# Patient Record
Sex: Male | Born: 1990 | Race: Black or African American | Hispanic: No | Smoking: Never smoker
Health system: Southern US, Community
[De-identification: ages and names within clinical notes are randomized; demographics above are authoritative.]

---
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
## 2015-02-01 ENCOUNTER — Encounter (HOSPITAL_COMMUNITY): Payer: Self-pay | Admitting: Emergency Medicine

## 2015-02-01 ENCOUNTER — Emergency Department (HOSPITAL_COMMUNITY)
Admission: EM | Admit: 2015-02-01 | Discharge: 2015-02-01 | Disposition: A | Discharge status: Home / Self Care | Payer: Self-pay | Attending: Emergency Medicine | Admitting: Emergency Medicine

## 2015-02-01 ENCOUNTER — Emergency Department (HOSPITAL_COMMUNITY): Payer: Self-pay

## 2015-02-01 DIAGNOSIS — Y939 Activity, unspecified: Secondary | ICD-10-CM | POA: Insufficient documentation

## 2015-02-01 DIAGNOSIS — S42301B Unspecified fracture of shaft of humerus, right arm, initial encounter for open fracture: Secondary | ICD-10-CM

## 2015-02-01 DIAGNOSIS — Y929 Unspecified place or not applicable: Secondary | ICD-10-CM | POA: Insufficient documentation

## 2015-02-01 DIAGNOSIS — T1490XA Injury, unspecified, initial encounter: Secondary | ICD-10-CM

## 2015-02-01 DIAGNOSIS — Y999 Unspecified external cause status: Secondary | ICD-10-CM | POA: Insufficient documentation

## 2015-02-01 DIAGNOSIS — S42391B Other fracture of shaft of right humerus, initial encounter for open fracture: Secondary | ICD-10-CM | POA: Insufficient documentation

## 2015-02-01 LAB — COMPREHENSIVE METABOLIC PANEL
ALT: 12 U/L — ABNORMAL LOW (ref 17–63)
ANION GAP: 15 (ref 5–15)
AST: 32 U/L (ref 15–41)
Albumin: 4.3 g/dL (ref 3.5–5.0)
Alkaline Phosphatase: 36 U/L — ABNORMAL LOW (ref 38–126)
BILIRUBIN TOTAL: 0.6 mg/dL (ref 0.3–1.2)
BUN: 14 mg/dL (ref 6–20)
CO2: 19 mmol/L — ABNORMAL LOW (ref 22–32)
Calcium: 9.6 mg/dL (ref 8.9–10.3)
Chloride: 103 mmol/L (ref 101–111)
Creatinine, Ser: 1.56 mg/dL — ABNORMAL HIGH (ref 0.61–1.24)
GFR calc non Af Amer: >60 mL/min (ref >60)
Glucose, Bld: 135 mg/dL — ABNORMAL HIGH (ref 65–99)
POTASSIUM: 3.4 mmol/L — AB (ref 3.5–5.1)
Sodium: 137 mmol/L (ref 135–145)
TOTAL PROTEIN: 8 g/dL (ref 6.5–8.1)

## 2015-02-01 LAB — PROTIME-INR
INR: 1.14 (ref 0.00–1.49)
PROTHROMBIN TIME: 14.8 s (ref 11.6–15.2)

## 2015-02-01 LAB — CBC
HEMATOCRIT: 41.8 % (ref 39.0–52.0)
HEMOGLOBIN: 13.8 g/dL (ref 13.0–17.0)
MCH: 24.6 pg — ABNORMAL LOW (ref 26.0–34.0)
MCHC: 33 g/dL (ref 30.0–36.0)
MCV: 74.6 fL — ABNORMAL LOW (ref 78.0–100.0)
Platelets: 205 10*3/uL (ref 150–400)
RBC: 5.6 MIL/uL (ref 4.22–5.81)
RDW: 15 % (ref 11.5–15.5)
WBC: 8.2 10*3/uL (ref 4.0–10.5)

## 2015-02-01 LAB — PREPARE FRESH FROZEN PLASMA
Unit division: 0
Unit division: 0

## 2015-02-01 LAB — TYPE AND SCREEN
ABO/RH(D): A POS
Antibody Screen: NEGATIVE
UNIT DIVISION: 0
UNIT DIVISION: 0

## 2015-02-01 LAB — ABO/RH: ABO/RH(D): A POS

## 2015-02-01 LAB — ETHANOL: Alcohol, Ethyl (B): <5 mg/dL (ref <5)

## 2015-02-01 MED ORDER — OXYCODONE-ACETAMINOPHEN 5-325 MG PO TABS: 1.0000 | ORAL_TABLET | Freq: Four times a day (QID) | ORAL | Status: AC | PRN

## 2015-02-01 MED ORDER — SODIUM CHLORIDE 0.9 % IV BOLUS (SEPSIS)
1000.0000 mL | Freq: Once | INTRAVENOUS | Status: AC
  Administered 2015-02-01: 1000 mL via INTRAVENOUS

## 2015-02-01 MED ORDER — ONDANSETRON HCL 4 MG/2ML IJ SOLN
4.0000 mg | Freq: Once | INTRAMUSCULAR | Status: AC
  Administered 2015-02-01: 4 mg via INTRAVENOUS

## 2015-02-01 MED ORDER — HYDROMORPHONE HCL 1 MG/ML IJ SOLN
1.0000 mg | Freq: Once | INTRAMUSCULAR | Status: AC
  Administered 2015-02-01: 1 mg via INTRAVENOUS

## 2015-02-01 MED ORDER — CYCLOBENZAPRINE HCL 10 MG PO TABS: 10.0000 mg | ORAL_TABLET | Freq: Two times a day (BID) | ORAL | Status: AC | PRN

## 2015-02-01 MED ORDER — OXYCODONE-ACETAMINOPHEN 5-325 MG PO TABS
2.0000 | ORAL_TABLET | Freq: Once | ORAL | Status: AC
  Administered 2015-02-01: 2 via ORAL
  Filled 2015-02-01: qty 2

## 2015-02-01 MED ORDER — IOHEXOL 350 MG/ML SOLN
100.0000 mL | Freq: Once | INTRAVENOUS | Status: AC | PRN
  Administered 2015-02-01: 100 mL via INTRAVENOUS

## 2015-02-01 MED ORDER — HYDROMORPHONE HCL 1 MG/ML IJ SOLN
1.0000 mg | Freq: Once | INTRAMUSCULAR | Status: AC
  Administered 2015-02-01: 1 mg via INTRAVENOUS
  Filled 2015-02-01: qty 1
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
## 2015-02-01 NOTE — ED Notes (Signed)
Ortho tech at bedside to splint arm 

## 2015-02-01 NOTE — ED Notes (Addendum)
One black wallet and two one dollar bills locked in security. Apple watch and silver colored necklace added to belongings for security. Verified with Eme, RN

## 2015-02-01 NOTE — ED Notes (Addendum)
Ortho tech placing \\plaster  splint  To  The rt humerus

## 2015-02-01 NOTE — ED Notes (Signed)
Dr Rayburn Ma here to see

## 2015-02-01 NOTE — ED Notes (Signed)
Dr. Blackman at bedside. 

## 2015-02-01 NOTE — ED Notes (Signed)
Pt at CT, Eme RN with patient and patient.

## 2015-02-01 NOTE — ED Notes (Signed)
Assisted Ortho with splinting of right humerus
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
## 2015-02-07 ENCOUNTER — Encounter (HOSPITAL_COMMUNITY): Payer: Self-pay | Admitting: Emergency Medicine

## 2015-02-07 ENCOUNTER — Emergency Department (HOSPITAL_COMMUNITY)
Admission: EM | Admit: 2015-02-07 | Discharge: 2015-02-07 | Disposition: A | Discharge status: Home / Self Care | Payer: Medicaid Other | Attending: Emergency Medicine | Admitting: Emergency Medicine

## 2015-02-07 DIAGNOSIS — Z5189 Encounter for other specified aftercare: Secondary | ICD-10-CM

## 2015-02-07 DIAGNOSIS — Z4801 Encounter for change or removal of surgical wound dressing: Secondary | ICD-10-CM | POA: Insufficient documentation

## 2015-02-07 DIAGNOSIS — Z792 Long term (current) use of antibiotics: Secondary | ICD-10-CM | POA: Insufficient documentation

## 2015-02-07 LAB — CBC
HCT: 33.7 % — ABNORMAL LOW (ref 39.0–52.0)
Hemoglobin: 11.2 g/dL — ABNORMAL LOW (ref 13.0–17.0)
MCH: 24.8 pg — AB (ref 26.0–34.0)
MCHC: 33.2 g/dL (ref 30.0–36.0)
MCV: 74.6 fL — AB (ref 78.0–100.0)
PLATELETS: 278 10*3/uL (ref 150–400)
RBC: 4.52 MIL/uL (ref 4.22–5.81)
RDW: 14.5 % (ref 11.5–15.5)
WBC: 7 10*3/uL (ref 4.0–10.5)
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
## 2015-02-07 NOTE — Discharge Instructions (Signed)
Your wounds appear stable today. No signs of active bleeding. Return for worsening symptoms, including new weakness or numbness, worsening bleeding where it is actively soaking through your bandage, or any other symptoms concerning to you. Please follow-up with your orthopedic surgeon as scheduled in 4 days.  Wound Check Your wound appears healthy today. Your wound will heal gradually over time. Eventually a scar will form that will fade with time. FACTORS THAT AFFECT SCAR FORMATION:  People differ in the severity in which they scar.  Scar severity varies according to location, size, and the traits you inherited from your parents (genetic predisposition).  Irritation to the wound from infection, rubbing, or chemical exposure will increase the amount of scar formation. HOME CARE INSTRUCTIONS   If you were given a dressing, you should change it at least once a day or as instructed by your caregiver. If the bandage sticks, soak it off with a solution of hydrogen peroxide.  If the bandage becomes wet, dirty, or develops a bad smell, change it as soon as possible.  Look for signs of infection.  Only take over-the-counter or prescription medicines for pain, discomfort, or fever as directed by your caregiver. SEEK IMMEDIATE MEDICAL CARE IF:   You have redness, swelling, or increasing pain in the wound.  You notice pus coming from the wound.  You have a fever.  You notice a bad smell coming from the wound or dressing. Document Released: 01/30/2004 Document Revised: 07/18/2011 Document Reviewed: 04/25/2005 [facility] Patient Information 2015 [place], [place]. This information is not intended to replace advice given to you by your health care provider. Make sure you discuss any questions you have with your health care provider.
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
